# Patient Record
Sex: Female | Born: 1985 | Race: Asian | Hispanic: No | Smoking: Never smoker
Health system: Southern US, Community
[De-identification: ages and names within clinical notes are randomized; demographics above are authoritative.]

## PROBLEM LIST (undated history)

## (undated) DIAGNOSIS — K311 Adult hypertrophic pyloric stenosis: Secondary | ICD-10-CM

---

## 2014-05-08 HISTORY — PX: ESOPHAGOGASTRODUODENOSCOPY ENDOSCOPY: SHX5814

## 2014-06-08 ENCOUNTER — Inpatient Hospital Stay (HOSPITAL_COMMUNITY)
Admission: EM | Admit: 2014-06-08 | Discharge: 2014-06-13 | DRG: 177 | Disposition: A | Discharge status: Home / Self Care | Payer: PRIVATE HEALTH INSURANCE | Attending: Gastroenterology | Admitting: Gastroenterology

## 2014-06-08 ENCOUNTER — Encounter (HOSPITAL_COMMUNITY): Payer: Self-pay | Admitting: General Practice

## 2014-06-08 ENCOUNTER — Emergency Department (HOSPITAL_COMMUNITY): Payer: PRIVATE HEALTH INSURANCE

## 2014-06-08 DIAGNOSIS — K311 Adult hypertrophic pyloric stenosis: Secondary | ICD-10-CM

## 2014-06-08 DIAGNOSIS — J9601 Acute respiratory failure with hypoxia: Secondary | ICD-10-CM | POA: Diagnosis present

## 2014-06-08 DIAGNOSIS — R0602 Shortness of breath: Secondary | ICD-10-CM

## 2014-06-08 DIAGNOSIS — T17908A Unspecified foreign body in respiratory tract, part unspecified causing other injury, initial encounter: Secondary | ICD-10-CM

## 2014-06-08 DIAGNOSIS — J690 Pneumonitis due to inhalation of food and vomit: Principal | ICD-10-CM

## 2014-06-08 DIAGNOSIS — Q400 Congenital hypertrophic pyloric stenosis: Secondary | ICD-10-CM

## 2014-06-08 DIAGNOSIS — K209 Esophagitis, unspecified: Secondary | ICD-10-CM | POA: Diagnosis present

## 2014-06-08 DIAGNOSIS — T17998A Other foreign object in respiratory tract, part unspecified causing other injury, initial encounter: Secondary | ICD-10-CM

## 2014-06-08 DIAGNOSIS — T17908D Unspecified foreign body in respiratory tract, part unspecified causing other injury, subsequent encounter: Secondary | ICD-10-CM

## 2014-06-08 HISTORY — DX: Adult hypertrophic pyloric stenosis: K31.1

## 2014-06-08 LAB — I-STAT CHEM 8, ED
BUN: 11 mg/dL (ref 6–23)
Calcium, Ion: 1.14 mmol/L (ref 1.12–1.23)
Chloride: 102 mmol/L (ref 96–112)
Creatinine, Ser: 0.8 mg/dL (ref 0.50–1.10)
Glucose, Bld: 109 mg/dL — ABNORMAL HIGH (ref 70–99)
HEMATOCRIT: 44 % (ref 36.0–46.0)
HEMOGLOBIN: 15 g/dL (ref 12.0–15.0)
Potassium: 3.5 mmol/L (ref 3.5–5.1)
SODIUM: 141 mmol/L (ref 135–145)
TCO2: 24 mmol/L (ref 0–100)

## 2014-06-08 LAB — MRSA PCR SCREENING: MRSA BY PCR: NEGATIVE

## 2014-06-08 LAB — CBC WITH DIFFERENTIAL/PLATELET
BASOS PCT: 0 % (ref 0–1)
Basophils Absolute: 0 10*3/uL (ref 0.0–0.1)
EOS PCT: 1 % (ref 0–5)
Eosinophils Absolute: 0 10*3/uL (ref 0.0–0.7)
HEMATOCRIT: 42.2 % (ref 36.0–46.0)
HEMOGLOBIN: 13.5 g/dL (ref 12.0–15.0)
Lymphocytes Relative: 23 % (ref 12–46)
Lymphs Abs: 1.2 10*3/uL (ref 0.7–4.0)
MCH: 30.3 pg (ref 26.0–34.0)
MCHC: 32 g/dL (ref 30.0–36.0)
MCV: 94.8 fL (ref 78.0–100.0)
MONO ABS: 0.1 10*3/uL (ref 0.1–1.0)
MONOS PCT: 2 % — AB (ref 3–12)
Neutro Abs: 3.9 10*3/uL (ref 1.7–7.7)
Neutrophils Relative %: 74 % (ref 43–77)
Platelets: 207 10*3/uL (ref 150–400)
RBC: 4.45 MIL/uL (ref 3.87–5.11)
RDW: 13.5 % (ref 11.5–15.5)
WBC: 5.3 10*3/uL (ref 4.0–10.5)

## 2014-06-08 MED ORDER — SODIUM CHLORIDE 0.9 % IV SOLN
INTRAVENOUS | Status: DC
  Administered 2014-06-08 – 2014-06-09 (×4): via INTRAVENOUS

## 2014-06-08 MED ORDER — PANTOPRAZOLE SODIUM 40 MG IV SOLR
40.0000 mg | Freq: Two times a day (BID) | INTRAVENOUS | Status: DC
  Administered 2014-06-08 – 2014-06-09 (×3): 40 mg via INTRAVENOUS
  Filled 2014-06-08 (×4): qty 40

## 2014-06-08 MED ORDER — ACETAMINOPHEN 325 MG PO TABS
650.0000 mg | ORAL_TABLET | Freq: Four times a day (QID) | ORAL | Status: DC | PRN
Start: 2014-06-08 — End: 2014-06-13
  Administered 2014-06-09 – 2014-06-12 (×4): 650 mg via ORAL
  Filled 2014-06-08 (×4): qty 2

## 2014-06-08 MED ORDER — GUAIFENESIN 100 MG/5ML PO SYRP
200.0000 mg | ORAL_SOLUTION | ORAL | Status: DC | PRN
  Administered 2014-06-10 – 2014-06-12 (×7): 200 mg via ORAL
  Filled 2014-06-08 (×11): qty 10

## 2014-06-08 MED ORDER — SODIUM CHLORIDE 0.9 % IV BOLUS (SEPSIS)
500.0000 mL | Freq: Once | INTRAVENOUS | Status: AC
  Administered 2014-06-08: 500 mL via INTRAVENOUS

## 2014-06-08 NOTE — ED Provider Notes (Signed)
CSN: [id number]     Arrival date & time    History   First MD Initiated Contact with Patient 06/08/14 1252     Chief Complaint  Patient presents with  . Aspiration     (Consider location/radiation/quality/duration/timing/severity/associated sxs/prior Treatment) Patient is a 29 y.o. female presenting with shortness of breath. The history is provided by the EMS personnel (pt had an upper endoscopy today and aspirated.  she was sent here from endoscopy).  Shortness of Breath Severity:  Moderate Onset quality:  Sudden Timing:  Constant Progression:  Waxing and waning Chronicity:  New Context: not activity   Relieved by:  Nothing Worsened by:  Activity Ineffective treatments:  None tried Associated symptoms: no abdominal pain, no chest pain, no cough, no headaches and no rash     Past Medical History  Diagnosis Date  . Pyloric stenosis    History reviewed. No pertinent past surgical history. No family history on file. History  Substance Use Topics  . Smoking status: Never Smoker   . Smokeless tobacco: Not on file  . Alcohol Use: Not on file   OB History    No data available     Review of Systems  Constitutional: Negative for appetite change and fatigue.  HENT: Negative for congestion, ear discharge and sinus pressure.   Eyes: Negative for discharge.  Respiratory: Positive for shortness of breath. Negative for cough.   Cardiovascular: Negative for chest pain.  Gastrointestinal: Negative for abdominal pain and diarrhea.  Genitourinary: Negative for frequency and hematuria.  Musculoskeletal: Negative for back pain.  Skin: Negative for rash.  Neurological: Negative for seizures and headaches.  Psychiatric/Behavioral: Negative for hallucinations.      Allergies  Review of patient's allergies indicates not on file.  Home Medications   Prior to Admission medications   Not on File   BP 106/60 mmHg  Pulse 69  Temp(Src) 97.5 F (36.4 C) (Oral)  Resp 19  Ht 5\' 3"   (1.6 m)  Wt 110 lb (49.896 kg)  BMI 19.49 kg/m2  SpO2 95%  LMP 06/08/2014 Physical Exam  Constitutional: She is oriented to person, place, and time. She appears well-developed.  HENT:  Head: Normocephalic.  Eyes: Conjunctivae and EOM are normal. No scleral icterus.  Neck: Neck supple. No thyromegaly present.  Cardiovascular: Normal rate and regular rhythm.  Exam reveals no gallop and no friction rub.   No murmur heard. Pulmonary/Chest: No stridor. She has no wheezes. She has rales. She exhibits no tenderness.  Abdominal: She exhibits no distension. There is no tenderness. There is no rebound.  Musculoskeletal: Normal range of motion. She exhibits no edema.  Lymphadenopathy:    She has no cervical adenopathy.  Neurological: She is oriented to person, place, and time. She exhibits normal muscle tone. Coordination normal.  Skin: No rash noted. No erythema.  Psychiatric: She has a normal mood and affect. Her behavior is normal.    ED Course  Procedures (including critical care time) Labs Review Labs Reviewed  CBC WITH DIFFERENTIAL/PLATELET - Abnormal; Notable for the following:    Monocytes Relative 2 (*)    All other components within normal limits  I-STAT CHEM 8, ED - Abnormal; Notable for the following:    Glucose, Bld 109 (*)    All other components within normal limits    Imaging Review Dg Chest Port 1 View  06/08/2014   CLINICAL DATA:  Shortness of Breath  EXAM: PORTABLE CHEST - 1 VIEW  COMPARISON:  None.  FINDINGS: Cardiomediastinal  silhouette is unremarkable. There is gaseous distension of visualized stomach. There is streaky bilateral basilar atelectasis or infiltrate left greater than right. No pulmonary edema.  IMPRESSION: No pulmonary edema. Streaky bilateral basilar atelectasis or infiltrate left greater than right. Significant gaseous distension of visualized stomach.   Electronically Signed   By: Natasha Mead M.D.   On: 06/08/2014 13:21     EKG Interpretation None       MDM   Final diagnoses:  SOB (shortness of breath)    Admit for aspiration and hypoxia    Benny Lennert, MD 06/08/14 1427

## 2014-06-08 NOTE — ED Notes (Signed)
Attempted Report 

## 2014-06-08 NOTE — Progress Notes (Signed)
She continues to feel better.  She reports some soreness in her chest, which is expected.  Dr. Ulyses JarredMcQuaid's input is greatly appreciated.  Currently she is on 3.5 liters of oxygen and maintaining saturations at 98%.  Her HR is in the 90's and her RR is approximately 32.  I discussed the events that lead to this hospitalization.  I encouraged her to continue with incentive spirometry.  No antibiotics at this time, per Dr. Kendrick FriesMcQuaid, as this is being treated as a chemical pneumonitis.

## 2014-06-08 NOTE — H&P (Signed)
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  She was struggling with the symptoms since November and at the beginning of the year she started to have weight loss.  Two weeks ago her index EGd revealed the esophagitis and pyloric stenosis.  There was a significant amount of retained gastric contents at that time.  The pylorus was edematous, but with the pediatric colonoscope the pylorus was traversed and dilated with the passage of the colonoscope.  No issues occurred with that procedure and she was treated aggressively with Dexilant BID.  The patient followed up two weeks later and clinically she reported feeling "great", however, she still had some early satiety with her symptoms.  Additionally, she had lost 2 lbs from 114 lbs to 112 lbs.  The conclusion was that her weight loss was from the recommended liquid diet.  Discussion was made about a repeat EGD and optimally I wanted to have an update in one week, however, [place].  With the early satiety symptoms, we agreed to repeat the EGD.  During this procedure the esophagitis was improved and down graded to an LA Grade B esophagitis.  There was a moderate amount of retained gastric contents, which appeared to be less as compared to the index procedure.  The exact amount was difficult to assess as I decompressed the lumen to allow for pyloric channel passage.  I was not able to pass the adult endoscope through they pylorus.  A balloon dilator was then prepared to apply therapy to the site.  During the preparation of the balloon the gastric contents were suctioned, but particular material prevented a significant reduction of the contents.  The stenosis was dilated to 15 mm with easy passage of the endoscope and the plan was to dilate further to 16.5 mm.  At this time the patient started to wretch the  gastric contents were vomited.  The endoscopic procedure was terminated.  An Ambu bag was applied as her oxygen saturation decreased into the 88% range.  All gastric contents were suctioned from her oral cavity.  During this time her respirations were spontaneous, but she required assistance with the Ambu bag.  Her oxygen saturation ranged from 88%-95%.  With mild compression of the Ambu bag there was some resistance outside of her coughing.  Her oxygen saturation decreased in t6he low 80% range and an ET tube was emergently inserted.  Some gastric contents were noted in the ET tube and the tube was suctioned, which helped to clear the lumen.  Her O2 saturation in creased to 92-97%.  Slowly she started to regain consciousness, but she was rather combative.  It was clear that she was not going to tolerate the ET tube in a conscious state.  Prior to removing the ET tube a trail off of oxygen was performed and she was able to maintain her oxygen saturation from 94-97%.  At that point the tube was removed and she was respiring spontaneously.  EMS was called when the ET tube demonstrated gastric contents.  EMS transferred her from the endo unit to the ER in an alert and verbally responsive state.  She was on 6L of oxygen with a facemask.  Past Medical History  Diagnosis Date  . Pyloric stenosis     History reviewed. No pertinent past surgical history.  No family  history on file.  Social History:  reports that she has never smoked. She does not have any smokeless tobacco history on file. Her alcohol and drug histories are not on file.  Allergies: Not on File  Medications:  Scheduled: . pantoprazole (PROTONIX) IV  40 mg Intravenous Q12H   Continuous: . sodium chloride 150 mL/hr at 06/08/14 1401    Results for orders placed or performed during the hospital encounter of 06/08/14 (from the past 24 hour(s))  I-stat chem 8, ed     Status: Abnormal   Collection Time: 06/08/14  1:46 PM  Result Value Ref  Range   Sodium 141 135 - 145 mmol/L   Potassium 3.5 3.5 - 5.1 mmol/L   Chloride 102 96 - 112 mmol/L   BUN 11 6 - 23 mg/dL   Creatinine, Ser 1.61 0.50 - 1.10 mg/dL   Glucose, Bld 096 (H) 70 - 99 mg/dL   Calcium, Ion 0.45 4.09 - 1.23 mmol/L   TCO2 24 0 - 100 mmol/L   Hemoglobin 15.0 12.0 - 15.0 g/dL   HCT 81.1 91.4 - 78.2 %     Dg Chest Port 1 View  06/08/2014   CLINICAL DATA:  Shortness of Breath  EXAM: PORTABLE CHEST - 1 VIEW  COMPARISON:  None.  FINDINGS: Cardiomediastinal silhouette is unremarkable. There is gaseous distension of visualized stomach. There is streaky bilateral basilar atelectasis or infiltrate left greater than right. No pulmonary edema.  IMPRESSION: No pulmonary edema. Streaky bilateral basilar atelectasis or infiltrate left greater than right. Significant gaseous distension of visualized stomach.   Electronically Signed   By: Natasha Mead M.D.   On: 06/08/2014 13:21    ROS:  As stated above in the HPI otherwise negative.  Blood pressure 106/60, pulse 69, temperature 97.5 F (36.4 C), temperature source Oral, resp. rate 19, height  (1.6 m), weight 49.896 kg (110 lb), last menstrual period 06/08/2014, SpO2 95 %.    PE: Gen: NAD, Alert and Oriented HEENT:  Lillie/AT, EOMI Neck: Supple, no LAD Lungs: Some upper airway rhonchi and bibasilar crackles CV: RRR without M/G/R ABM: Soft, NTND, +BS Ext: No C/C/E  Assessment/Plan: 1) Aspiration pneumonia. 2) Pyloric stenosis. 3) LA Grade B esophagitis.   Clinically she is stable and she continues to improve, however, she still requires oxygen.  I will admit the patient and I requested a consult from Pulmonary to help manage her situation.    Plan: 1) CXR. 2) Antibiotics per Pulmonary. 3) Admit to Stepdown. 4) IV hydration. 5) NPO for now. 6) PPI  Lowery Paullin D 06/08/2014, 1:56 PM

## 2014-06-08 NOTE — ED Notes (Signed)
Pt brought in via EMS. [facility]. During procedure pt started throwing up "chucks". Pt was on propofol sedative. Post emesis pt was extubated prior to EMS arrival. Pt reports being NPO prior to procedure. EMS V/S 96% RA, 108/70-B/P, 70 HR, NSR.

## 2014-06-08 NOTE — Consult Note (Signed)
PULMONARY / CRITICAL CARE MEDICINE   Name: Amy Farrell MRN: [id number] DOB: Feb 09, 1986    ADMISSION DATE:  06/08/2014 CONSULTATION DATE:  06/08/2014  REFERRING MD :  Elnoria Howard  CHIEF COMPLAINT:  Shortness of breath, cough  INITIAL PRESENTATION: 29 y/o female with pyloric stenosis who underwent endoscopy on 06/08/2014 and unfortunately had an aspiration event during the procedure. She was admitted on 06/08/2014 for observation at [facility]. Pulmonary and critical care medicine was consulted for further evaluation.  STUDIES:    SIGNIFICANT EVENTS:    HISTORY OF PRESENT ILLNESS:  This is a 29 year old female with a past medical history significant for pyloric stenosis and esophagitis who underwent an endoscopy on 06/08/2014 for further evaluation of the same. The procedure initially was uncomplicated and she did receive balloon dilation of the pyloric stenosis but unfortunately she had aspiration of retained gastric contents. This was performed at an outpatient endoscopy center. Because of acute hypoxemic respiratory failure she was intubated temporarily at the conclusion of the procedure. She was able to be extubated but she was still required high oxygen requirements and so she was brought to [facility] for further evaluation of hypoxemic respiratory failure. In the emergency department her oxygen requirements have decreased in her shortness of breath has improved but she continues to have a cough. She also notes some mild throat pain.  PAST MEDICAL HISTORY :   has a past medical history of Pyloric stenosis.  has no past surgical history on file. Prior to Admission medications   Not on File   Not on File  FAMILY HISTORY:  has no family status information on file.  SOCIAL HISTORY:  reports that she has never smoked. She does not have any smokeless tobacco history on file.  REVIEW OF SYSTEMS:   Gen: Denies fever, chills, weight change, fatigue, night sweats HEENT: Denies  blurred vision, double vision, hearing loss, tinnitus, sinus congestion, rhinorrhea, sore throat, neck stiffness, dysphagia PULM: Denies shortness of breath, cough, sputum production, hemoptysis, wheezing CV: Denies chest pain, edema, orthopnea, paroxysmal nocturnal dyspnea, palpitations GI: Denies abdominal pain, nausea, vomiting, diarrhea, hematochezia, melena, constipation, change in bowel habits GU: Denies dysuria, hematuria, polyuria, oliguria, urethral discharge Endocrine: Denies hot or cold intolerance, polyuria, polyphagia or appetite change Derm: Denies rash, dry skin, scaling or peeling skin change Heme: Denies easy bruising, bleeding, bleeding gums Neuro: Denies headache, numbness, weakness, slurred speech, loss of memory or consciousness  SUBJECTIVE:   VITAL SIGNS: Temp:  [97.5 F (36.4 C)] 97.5 F (36.4 C) (03/03 1233) Pulse Rate:  [63-93] 93 (03/03 1430) Resp:  [16-31] 27 (03/03 1430) BP: (95-113)/(54-75) 108/59 mmHg (03/03 1430) SpO2:  [79 %-100 %] 95 % (03/03 1430) Weight:  [49.896 kg (110 lb)] 49.896 kg (110 lb) (03/03 1233) HEMODYNAMICS:   VENTILATOR SETTINGS:   INTAKE / OUTPUT: No intake or output data in the 24 hours ending 06/08/14 1541  PHYSICAL EXAMINATION: General:  Comfortable in bed Neuro:  Awake and alert, non-focal HEENT:  NCAT EOMi Cardiovascular:  RRR, no mgr Lungs:  Scattered crackles and wheezes Abdomen:  BS+, soft, nontender Musculoskeletal:  Normal bulk and tone Skin:  No breakdown  LABS:  CBC  Recent Labs Lab 06/08/14 1332 06/08/14 1346  WBC 5.3  --   HGB 13.5 15.0  HCT 42.2 44.0  PLT 207  --    Coag's No results for input(s): APTT, INR in the last 168 hours. BMET  Recent Labs Lab 06/08/14 1346  NA 141  K 3.5  CL 102  BUN 11  CREATININE 0.80  GLUCOSE 109*   Electrolytes No results for input(s): CALCIUM, MG, PHOS in the last 168 hours. Sepsis Markers No results for input(s): LATICACIDVEN, PROCALCITON, O2SATVEN in  the last 168 hours. ABG No results for input(s): PHART, PCO2ART, PO2ART in the last 168 hours. Liver Enzymes No results for input(s): AST, ALT, ALKPHOS, BILITOT, ALBUMIN in the last 168 hours. Cardiac Enzymes No results for input(s): TROPONINI, PROBNP in the last 168 hours. Glucose No results for input(s): GLUCAP in the last 168 hours.  Imaging No results found.   ASSESSMENT / PLAN:  PULMONARY A: Acute hypoxemic respiratory failure in the setting of aspiration pneumonitis > currently protecting airway, tachypnic but comfortable, oxygen needs are improving P:   Wean O2 as tolerated for O2 saturation > 92% Incentive spirometry Flutter valve Guaifenesin Monitor in ICU setting Hold antibiotics   GASTROINTESTINAL A:  Pyloric stenosis P:   Per GI Diet per GI OK to eat from my standpoint   Heber CarolinaBrent McQuaid, MD [facility] Pager: [phone] Cell: [phone] If no response, call [phone]  06/08/2014, 3:41 PM

## 2014-06-09 DIAGNOSIS — T17998D Other foreign object in respiratory tract, part unspecified causing other injury, subsequent encounter: Secondary | ICD-10-CM

## 2014-06-09 MED ORDER — KETOROLAC TROMETHAMINE 15 MG/ML IJ SOLN
INTRAMUSCULAR | Status: AC
  Filled 2014-06-09: qty 1

## 2014-06-09 MED ORDER — KETOROLAC TROMETHAMINE 15 MG/ML IJ SOLN: 15.0000 mg | Freq: Three times a day (TID) | INTRAMUSCULAR | Status: DC | PRN

## 2014-06-09 MED ORDER — PANTOPRAZOLE SODIUM 40 MG PO TBEC
40.0000 mg | DELAYED_RELEASE_TABLET | Freq: Two times a day (BID) | ORAL | Status: DC
  Administered 2014-06-09 – 2014-06-13 (×8): 40 mg via ORAL
  Filled 2014-06-09 (×8): qty 1

## 2014-06-09 MED ORDER — KETOROLAC TROMETHAMINE 15 MG/ML IJ SOLN
15.0000 mg | Freq: Three times a day (TID) | INTRAMUSCULAR | Status: DC | PRN
  Administered 2014-06-09: 15 mg via INTRAVENOUS

## 2014-06-09 NOTE — Progress Notes (Signed)
PULMONARY / CRITICAL CARE MEDICINE   Name: Amy Farrell MRN: [id number] DOB: June 03, 1985    ADMISSION DATE:  06/08/2014 CONSULTATION DATE:  06/08/2014  REFERRING MD :  Elnoria HowardHung  CHIEF COMPLAINT:  Shortness of breath, cough  INITIAL PRESENTATION:  29 yo with aspiration event after endoscopy for pyloric stenosis.  STUDIES:   SIGNIFICANT EVENTS: 3/03 Admit  SUBJECTIVE:  Was having chest discomfort last night >> better after pain meds.  She has cough >> swallowing sputum.  Denies wheeze.  VITAL SIGNS: Temp:  [97.5 F (36.4 C)-99.8 F (37.7 C)] 98.1 F (36.7 C) (03/04 0700) Pulse Rate:  [63-114] 89 (03/04 0912) Resp:  [16-43] 31 (03/04 0912) BP: (81-113)/(40-75) 110/56 mmHg (03/04 0800) SpO2:  [79 %-100 %] 97 % (03/04 0912) Weight:  [110 lb (49.896 kg)-112 lb 10.5 oz (51.1 kg)] 112 lb 10.5 oz (51.1 kg) (03/03 1627) INTAKE / OUTPUT:  Intake/Output Summary (Last 24 hours) at 06/09/14 1059 Last data filed at 06/09/14 0145  Gross per 24 hour  Intake   1520 ml  Output      2 ml  Net   1518 ml    PHYSICAL EXAMINATION: General: pleasant Neuro: normal strength HEENT: no sinus tenderness Cardiovascular: regular Lungs: faint basilar crackles, no wheeze Abdomen: soft, non tender Musculoskeletal:  No edema Skin: no rashes   LABS: CMP Latest Ref Rng 06/08/2014  Glucose 70 - 99 mg/dL 409(W109(H)  BUN 6 - 23 mg/dL 11  Creatinine 1.190.50 - 1.471.10 mg/dL 8.290.80  Sodium 562135 - 130145 mmol/L 141  Potassium 3.5 - 5.1 mmol/L 3.5  Chloride 96 - 112 mmol/L 102    CBC    Component Value Date/Time   WBC 5.3 06/08/2014 1332   RBC 4.45 06/08/2014 1332   HGB 15.0 06/08/2014 1346   HCT 44.0 06/08/2014 1346   PLT 207 06/08/2014 1332   MCV 94.8 06/08/2014 1332   MCH 30.3 06/08/2014 1332   MCHC 32.0 06/08/2014 1332   RDW 13.5 06/08/2014 1332   LYMPHSABS 1.2 06/08/2014 1332   MONOABS 0.1 06/08/2014 1332   EOSABS 0.0 06/08/2014 1332   BASOSABS 0.0 06/08/2014 1332    Dg Chest Port 1 View  06/08/2014    CLINICAL DATA:  Shortness of Breath  EXAM: PORTABLE CHEST - 1 VIEW  COMPARISON:  None.  FINDINGS: Cardiomediastinal silhouette is unremarkable. There is gaseous distension of visualized stomach. There is streaky bilateral basilar atelectasis or infiltrate left greater than right. No pulmonary edema.  IMPRESSION: No pulmonary edema. Streaky bilateral basilar atelectasis or infiltrate left greater than right. Significant gaseous distension of visualized stomach.   Electronically Signed   By: Natasha MeadLiviu  Pop M.D.   On: 06/08/2014 13:21      ASSESSMENT / PLAN:  Acute hypoxic respiratory failure 2nd to aspiration event. Plan: - oxygen as needed to keep SpO2 > 92% - f/u CXR 3/05 - bronchial hygiene - defer ABx for now  Pyloric stenosis Plan: - per GI  Okay to transfer out of SDU from pulmonary standpoint >> if she is stable clinically and no significant findings on CXR 3/05, then she might be ready for d/c home soon.   Coralyn HellingVineet Cypher Paule, MD [facility] 06/09/2014, 11:02 AM Pager:  [phone] After 3pm call: [phone]

## 2014-06-09 NOTE — Progress Notes (Signed)
UR COMPLETED  

## 2014-06-09 NOTE — Progress Notes (Signed)
Subjective: She did not sleep well at night.  Feeling all right, but still with chest pain.  Objective: Vital signs in last 24 hours: Temp:  [97.5 F (36.4 C)-99.8 F (37.7 C)] 98.1 F (36.7 C) (03/04 0700) Pulse Rate:  [63-114] 89 (03/04 0912) Resp:  [16-43] 31 (03/04 0912) BP: (81-113)/(40-75) 110/56 mmHg (03/04 0800) SpO2:  [79 %-100 %] 97 % (03/04 0912) Weight:  [49.896 kg (110 lb)-51.1 kg (112 lb 10.5 oz)] 51.1 kg (112 lb 10.5 oz) (03/03 1627)    Intake/Output from previous day: 03/03 0701 - 03/04 0700 In: 1520 [P.O.:240; I.V.:1280] Out: 2 [Urine:2] Intake/Output this shift:    General appearance: alert and no distress Resp: clear to auscultation bilaterally GI: soft, non-tender; bowel sounds normal; no masses,  no organomegaly Extremities: extremities normal, atraumatic, no cyanosis or edema  Lab Results:  Recent Labs  06/08/14 1332 06/08/14 1346  WBC 5.3  --   HGB 13.5 15.0  HCT 42.2 44.0  PLT 207  --    BMET  Recent Labs  06/08/14 1346  NA 141  K 3.5  CL 102  GLUCOSE 109*  BUN 11  CREATININE 0.80   LFT No results for input(s): PROT, ALBUMIN, AST, ALT, ALKPHOS, BILITOT, BILIDIR, IBILI in the last 72 hours. PT/INR No results for input(s): LABPROT, INR in the last 72 hours. Hepatitis Panel No results for input(s): HEPBSAG, HCVAB, HEPAIGM, HEPBIGM in the last 72 hours. C-Diff No results for input(s): CDIFFTOX in the last 72 hours. Fecal Lactopherrin No results for input(s): FECLLACTOFRN in the last 72 hours.  Studies/Results: Dg Chest Port 1 View  06/08/2014   CLINICAL DATA:  Shortness of Breath  EXAM: PORTABLE CHEST - 1 VIEW  COMPARISON:  None.  FINDINGS: Cardiomediastinal silhouette is unremarkable. There is gaseous distension of visualized stomach. There is streaky bilateral basilar atelectasis or infiltrate left greater than right. No pulmonary edema.  IMPRESSION: No pulmonary edema. Streaky bilateral basilar atelectasis or infiltrate left  greater than right. Significant gaseous distension of visualized stomach.   Electronically Signed   By: Natasha MeadLiviu  Pop M.D.   On: 06/08/2014 13:21    Medications:  Scheduled: . pantoprazole (PROTONIX) IV  40 mg Intravenous Q12H   Continuous: . sodium chloride 150 mL/hr at 06/09/14 [id number]    Assessment/Plan: 1) Aspiration pneumonitis. 2) Pyloric stenosis.   The patient has improved, but she has note been able to tolerate O2 weaning beyond 4 liters.  Her O2 sats stay in the 90's, but her RR increases into the 40's.    Plan: 1) Incentive spirometry. 2) Wean O2 as tolerated. 3) Advance to a soft diet.   LOS: 1 day   Javontae Marlette D 06/09/2014, 9:26 AM

## 2014-06-09 NOTE — Progress Notes (Signed)
Pt to TX to [id number]-[id number], VSS, called report.

## 2014-06-10 ENCOUNTER — Inpatient Hospital Stay (HOSPITAL_COMMUNITY): Payer: PRIVATE HEALTH INSURANCE

## 2014-06-10 DIAGNOSIS — J690 Pneumonitis due to inhalation of food and vomit: Secondary | ICD-10-CM | POA: Insufficient documentation

## 2014-06-10 DIAGNOSIS — R0602 Shortness of breath: Secondary | ICD-10-CM

## 2014-06-10 MED ORDER — SODIUM CHLORIDE 0.9 % IV SOLN
1.5000 g | Freq: Four times a day (QID) | INTRAVENOUS | Status: DC
  Administered 2014-06-10 – 2014-06-13 (×12): 1.5 g via INTRAVENOUS
  Filled 2014-06-10 (×15): qty 1.5

## 2014-06-10 MED ORDER — KETOROLAC TROMETHAMINE 15 MG/ML IJ SOLN
15.0000 mg | Freq: Three times a day (TID) | INTRAMUSCULAR | Status: DC | PRN
  Administered 2014-06-10 – 2014-06-12 (×5): 15 mg via INTRAVENOUS
  Filled 2014-06-10 (×7): qty 1

## 2014-06-10 NOTE — Progress Notes (Signed)
Attempted to wean pt to 3L [place] overnight. Pt sats remained in mid 90s during this time. Pt tolerated well for approximately 2.5-3 hrs, but then stated that she felt like she was having trouble breathing. Increased O2 to 4L at pt's request. Nursing will continue to monitor.

## 2014-06-10 NOTE — Consult Note (Signed)
PULMONARY / CRITICAL CARE MEDICINE   Name: Amy Farrell MRN: [id number] DOB: July 25, 1985    ADMISSION DATE:  06/08/2014 CONSULTATION DATE:  06/08/2014  REFERRING MD :  Elnoria HowardHung  CHIEF COMPLAINT:  Shortness of breath, cough  INITIAL PRESENTATION: 29 y/o female with pyloric stenosis who underwent endoscopy on 06/08/2014 and unfortunately had an aspiration event during the procedure. She was admitted on 06/08/2014 [facility]. Pulmonary and critical care medicine was consulted for further evaluation.  STUDIES:    SIGNIFICANT EVENTS:    SUBJECTIVE:  3/5-Adequate English. Visitor in room. Reports starting to feel sweat and chill, sore anterior chest with cough. Swallows phlegm. Unable to wean O2 below 4L overnight.  VITAL SIGNS: Temp:  [98.2 F (36.8 C)-100 F (37.8 C)] 100 F (37.8 C) (03/05 0500) Pulse Rate:  [83-112] 103 (03/05 0500) Resp:  [20-31] 20 (03/05 0500) BP: (83-113)/(50-62) 113/61 mmHg (03/05 0500) SpO2:  [94 %-100 %] 94 % (03/05 0500) HEMODYNAMICS:   VENTILATOR SETTINGS:   INTAKE / OUTPUT:  Intake/Output Summary (Last 24 hours) at 06/10/14 0838 Last data filed at 06/10/14 0600  Gross per 24 hour  Intake    360 ml  Output      0 ml  Net    360 ml   CXR 3/5  PHYSICAL EXAMINATION: General:  Comfortable in bed Neuro:  Awake and alert, non-focal HEENT:  NCAT EOMi Cardiovascular:  RRR, no mgr Lungs:  Scattered crackles and wheezes, not labored short sentences, 4L nasal O2 94% Abdomen:  BS+, soft, nontender Musculoskeletal:  Normal bulk and tone Skin:  No breakdown  LABS:  CBC  Recent Labs Lab 06/08/14 1332 06/08/14 1346  WBC 5.3  --   HGB 13.5 15.0  HCT 42.2 44.0  PLT 207  --    Coag's No results for input(s): APTT, INR in the last 168 hours. BMET  Recent Labs Lab 06/08/14 1346  NA 141  K 3.5  CL 102  BUN 11  CREATININE 0.80  GLUCOSE 109*   Electrolytes No results for input(s): CALCIUM, MG, PHOS in the last 168  hours. Sepsis Markers No results for input(s): LATICACIDVEN, PROCALCITON, O2SATVEN in the last 168 hours. ABG No results for input(s): PHART, PCO2ART, PO2ART in the last 168 hours. Liver Enzymes No results for input(s): AST, ALT, ALKPHOS, BILITOT, ALBUMIN in the last 168 hours. Cardiac Enzymes No results for input(s): TROPONINI, PROBNP in the last 168 hours. Glucose No results for input(s): GLUCAP in the last 168 hours.  Imaging No results found.   ASSESSMENT / PLAN:  PULMONARY A: Acute hypoxemic respiratory failure in the setting of aspiration pneumonitis > currently protecting airway. She reports more discomfort. P:   Starting abx Unasyn 3/5>> CXR and CBC 3/6 Wean O2 as tolerated for O2 saturation > 92% Incentive spirometry Flutter valve Guaifenesin    GASTROINTESTINAL A:  Pyloric stenosis P:   Per GI Diet per GI OK to eat from my standpoint   CD Zamantha Strebel, MD,   PCCM p [phone]    m [phone] If no response or after 3:00 PM, call [phone]  06/10/2014, 8:38 AM

## 2014-06-10 NOTE — Progress Notes (Signed)
Subjective: Fever and chills last evening.  Objective: Vital signs in last 24 hours: Temp:  [98.2 F (36.8 C)-100 F (37.8 C)] 100 F (37.8 C) (03/05 0500) Pulse Rate:  [84-112] 103 (03/05 0500) Resp:  [20-31] 20 (03/05 0500) BP: (83-113)/(50-62) 113/61 mmHg (03/05 0500) SpO2:  [94 %-100 %] 94 % (03/05 0500) Last BM Date: 06/07/14  Intake/Output from previous day: 03/04 0701 - 03/05 0700 In: 360 [P.O.:360] Out: -  Intake/Output this shift:    General appearance: alert and fatigued Resp: some crackles as the bases GI: soft, non-tender; bowel sounds normal; no masses,  no organomegaly  Lab Results:  Recent Labs  06/08/14 1332 06/08/14 1346  WBC 5.3  --   HGB 13.5 15.0  HCT 42.2 44.0  PLT 207  --    BMET  Recent Labs  06/08/14 1346  NA 141  K 3.5  CL 102  GLUCOSE 109*  BUN 11  CREATININE 0.80   LFT No results for input(s): PROT, ALBUMIN, AST, ALT, ALKPHOS, BILITOT, BILIDIR, IBILI in the last 72 hours. PT/INR No results for input(s): LABPROT, INR in the last 72 hours. Hepatitis Panel No results for input(s): HEPBSAG, HCVAB, HEPAIGM, HEPBIGM in the last 72 hours. C-Diff No results for input(s): CDIFFTOX in the last 72 hours. Fecal Lactopherrin No results for input(s): FECLLACTOFRN in the last 72 hours.  Studies/Results: Dg Chest 2 View  06/10/2014   CLINICAL DATA:  Aspiration into the airway. Shortness of breath with midsternal chest pain for 2 days ago  EXAM: CHEST  2 VIEW  COMPARISON:  06/08/2014.  FINDINGS: The heart size and mediastinal contours are stable. There is significant interval worsening of bibasilar airspace opacities with an asymmetric component in the left perihilar region. There are probable small bilateral pleural effusions. No pneumothorax. Mild convex right thoracic scoliosis.  IMPRESSION: Significant worsening of bilateral airspace opacities consistent with aspiration pneumonia.   Electronically Signed   By: Carey BullocksWilliam  Veazey M.D.   On:  06/10/2014 10:52   Dg Chest Port 1 View  06/08/2014   CLINICAL DATA:  Shortness of Breath  EXAM: PORTABLE CHEST - 1 VIEW  COMPARISON:  None.  FINDINGS: Cardiomediastinal silhouette is unremarkable. There is gaseous distension of visualized stomach. There is streaky bilateral basilar atelectasis or infiltrate left greater than right. No pulmonary edema.  IMPRESSION: No pulmonary edema. Streaky bilateral basilar atelectasis or infiltrate left greater than right. Significant gaseous distension of visualized stomach.   Electronically Signed   By: Natasha MeadLiviu  Pop M.D.   On: 06/08/2014 13:21    Medications:  Scheduled: . ampicillin-sulbactam (UNASYN) IV  1.5 g Intravenous Q6H  . pantoprazole  40 mg Oral BID AC   Continuous:   Assessment/Plan: 1) Aspiration pneumonia. 2) Pyloric stenosis.   CXR reveals a significant worsening of the bilateral airspaces consistent with aspiration pneumonia.  She feels weaker today and she had a mild elevation in her temp at 100 F.  Dr. Maple HudsonYoung initiated Unasyn.  Plan: 1) Unasyn. 2) Continue with PPI. 3) Change Toradol to IV.   LOS: 2 days   Katherina Wimer D 06/10/2014, 11:09 AM

## 2014-06-11 DIAGNOSIS — K311 Adult hypertrophic pyloric stenosis: Secondary | ICD-10-CM

## 2014-06-11 DIAGNOSIS — Q400 Congenital hypertrophic pyloric stenosis: Secondary | ICD-10-CM

## 2014-06-11 LAB — CBC WITH DIFFERENTIAL/PLATELET
Basophils Absolute: 0 10*3/uL (ref 0.0–0.1)
Basophils Relative: 0 % (ref 0–1)
EOS ABS: 0.2 10*3/uL (ref 0.0–0.7)
Eosinophils Relative: 2 % (ref 0–5)
HEMATOCRIT: 36.4 % (ref 36.0–46.0)
HEMOGLOBIN: 11.9 g/dL — AB (ref 12.0–15.0)
LYMPHS ABS: 0.7 10*3/uL (ref 0.7–4.0)
Lymphocytes Relative: 7 % — ABNORMAL LOW (ref 12–46)
MCH: 30.1 pg (ref 26.0–34.0)
MCHC: 32.7 g/dL (ref 30.0–36.0)
MCV: 92.2 fL (ref 78.0–100.0)
MONO ABS: 0.3 10*3/uL (ref 0.1–1.0)
MONOS PCT: 3 % (ref 3–12)
NEUTROS PCT: 88 % — AB (ref 43–77)
Neutro Abs: 9.8 10*3/uL — ABNORMAL HIGH (ref 1.7–7.7)
Platelets: 181 10*3/uL (ref 150–400)
RBC: 3.95 MIL/uL (ref 3.87–5.11)
RDW: 14.1 % (ref 11.5–15.5)
WBC: 11.1 10*3/uL — AB (ref 4.0–10.5)

## 2014-06-11 LAB — BASIC METABOLIC PANEL
Anion gap: 6 (ref 5–15)
BUN: 9 mg/dL (ref 6–23)
CO2: 24 mmol/L (ref 19–32)
Calcium: 8.1 mg/dL — ABNORMAL LOW (ref 8.4–10.5)
Chloride: 107 mmol/L (ref 96–112)
Creatinine, Ser: 0.72 mg/dL (ref 0.50–1.10)
GFR calc Af Amer: >90 mL/min (ref >90)
GFR calc non Af Amer: >90 mL/min (ref >90)
GLUCOSE: 83 mg/dL (ref 70–99)
POTASSIUM: 3.7 mmol/L (ref 3.5–5.1)
SODIUM: 137 mmol/L (ref 135–145)

## 2014-06-11 NOTE — Progress Notes (Signed)
RN contacted RT concerning a Flutter order from several days ago. RN states that PT is not congested any longer and is capable of deep breathing and coughing.

## 2014-06-11 NOTE — Progress Notes (Signed)
PULMONARY / CRITICAL CARE MEDICINE   Name: Amy Farrell MRN: [id number] DOB: Feb 11, 1986    ADMISSION DATE:  06/08/2014 CONSULTATION DATE:  06/08/2014  REFERRING MD :  Elnoria HowardHung  CHIEF COMPLAINT:  Shortness of breath, cough  INITIAL PRESENTATION: 29 y/o female with pyloric stenosis who underwent endoscopy on 06/08/2014 and unfortunately had an aspiration event during the procedure. She was admitted on 06/08/2014 for observation at [facility]. Pulmonary and critical care medicine was consulted for further evaluation.  STUDIES:    SIGNIFICANT EVENTS:    SUBJECTIVE:  3/6- Feels better, not as tired, temp down.  VITAL SIGNS: Temp:  [98.4 F (36.9 C)-98.6 F (37 C)] 98.4 F (36.9 C) (03/06 0443) Pulse Rate:  [90-99] 91 (03/06 0443) Resp:  [18] 18 (03/06 0443) BP: (93-117)/(58-71) 117/71 mmHg (03/06 0443) SpO2:  [91 %-93 %] 93 % (03/06 0443) HEMODYNAMICS:   VENTILATOR SETTINGS:   INTAKE / OUTPUT:  Intake/Output Summary (Last 24 hours) at 06/11/14 0948 Last data filed at 06/11/14 0444  Gross per 24 hour  Intake    630 ml  Output      3 ml  Net    627 ml   CXR 3/5  PHYSICAL EXAMINATION: General:  Comfortable in bed Neuro:  Awake and alert, non-focal HEENT:  NCAT EOMi Cardiovascular:  RRR, no mgr Lungs:  Few crackles, unlabored Abdomen:  BS+, soft, nontender Musculoskeletal:  Normal bulk and tone Skin:  No breakdown  LABS:  CBC  Recent Labs Lab 06/08/14 1332 06/08/14 1346 06/11/14 0451  WBC 5.3  --  11.1*  HGB 13.5 15.0 11.9*  HCT 42.2 44.0 36.4  PLT 207  --  181   Coag's No results for input(s): APTT, INR in the last 168 hours. BMET  Recent Labs Lab 06/08/14 1346 06/11/14 0451  NA 141 137  K 3.5 3.7  CL 102 107  CO2  --  24  BUN 11 9  CREATININE 0.80 0.72  GLUCOSE 109* 83   Electrolytes  Recent Labs Lab 06/11/14 0451  CALCIUM 8.1*   Sepsis Markers No results for input(s): LATICACIDVEN, PROCALCITON, O2SATVEN in the last 168  hours. ABG No results for input(s): PHART, PCO2ART, PO2ART in the last 168 hours. Liver Enzymes No results for input(s): AST, ALT, ALKPHOS, BILITOT, ALBUMIN in the last 168 hours. Cardiac Enzymes No results for input(s): TROPONINI, PROBNP in the last 168 hours. Glucose No results for input(s): GLUCAP in the last 168 hours.  Imaging Dg Chest 2 View  06/10/2014   CLINICAL DATA:  Aspiration into the airway. Shortness of breath with midsternal chest pain for 2 days ago  EXAM: CHEST  2 VIEW  COMPARISON:  06/08/2014.  FINDINGS: The heart size and mediastinal contours are stable. There is significant interval worsening of bibasilar airspace opacities with an asymmetric component in the left perihilar region. There are probable small bilateral pleural effusions. No pneumothorax. Mild convex right thoracic scoliosis.  IMPRESSION: Significant worsening of bilateral airspace opacities consistent with aspiration pneumonia.   Electronically Signed   By: Carey BullocksWilliam  Veazey M.D.   On: 06/10/2014 10:52     ASSESSMENT / PLAN:  PULMONARY A: Acute hypoxemic respiratory failure in the setting of aspiration pneumonitis > currently protecting airway.      Aspiration pneumonia P:   Unasyn 3/5>> CXR, CBC 3/7 Wean O2 as tolerated for O2 saturation > 92% Incentive spirometry Flutter valve Guaifenesin    GASTROINTESTINAL A:  Pyloric stenosis P:   Per GI Diet per GI Eating  CD Alyssah Algeo, MD,   PCCM p [phone]    m [phone] If no response or after 3:00 PM, call [phone]  06/11/2014, 9:48 AM

## 2014-06-11 NOTE — Progress Notes (Signed)
     Progress Note   Subjective  *Complaining of mild nausea and pyrosis.  Continues with cough.**   Objective  Vital signs in last 24 hours: Temp:  [98.4 F (36.9 C)-98.6 F (37 C)] 98.4 F (36.9 C) (03/06 0443) Pulse Rate:  [90-99] 91 (03/06 0443) Resp:  [18] 18 (03/06 0443) BP: (93-117)/(58-71) 117/71 mmHg (03/06 0443) SpO2:  [91 %-93 %] 93 % (03/06 0443) Last BM Date: 06/07/14 General:   Alert,  Well-developed,   in NAD Heart:  Regular rate and rhythm; no murmurs Chest-left basilar rales Abdomen:  Soft, nontender and nondistended. Normal bowel sounds, without guarding, and without rebound.   Extremities:  Without edema. Neurologic:  Alert and  oriented x4;  grossly normal neurologically. Psych:  Alert and cooperative. Normal mood and affect.  Intake/Output from previous day: 03/05 0701 - 03/06 0700 In: 630 [P.O.:480; IV Piggyback:150] Out: 4 [Urine:4] Intake/Output this shift:    Lab Results:  Recent Labs  06/08/14 1332 06/08/14 1346 06/11/14 0451  WBC 5.3  --  11.1*  HGB 13.5 15.0 11.9*  HCT 42.2 44.0 36.4  PLT 207  --  181   BMET  Recent Labs  06/08/14 1346 06/11/14 0451  NA 141 137  K 3.5 3.7  CL 102 107  CO2  --  24  GLUCOSE 109* 83  BUN 11 9  CREATININE 0.80 0.72  CALCIUM  --  8.1*   LFT No results for input(s): PROT, ALBUMIN, AST, ALT, ALKPHOS, BILITOT, BILIDIR, IBILI in the last 72 hours. PT/INR No results for input(s): LABPROT, INR in the last 72 hours. Hepatitis Panel No results for input(s): HEPBSAG, HCVAB, HEPAIGM, HEPBIGM in the last 72 hours.  Studies/Results: Dg Chest 2 View  06/10/2014   CLINICAL DATA:  Aspiration into the airway. Shortness of breath with midsternal chest pain for 2 days ago  EXAM: CHEST  2 VIEW  COMPARISON:  06/08/2014.  FINDINGS: The heart size and mediastinal contours are stable. There is significant interval worsening of bibasilar airspace opacities with an asymmetric component in the left perihilar region.  There are probable small bilateral pleural effusions. No pneumothorax. Mild convex right thoracic scoliosis.  IMPRESSION: Significant worsening of bilateral airspace opacities consistent with aspiration pneumonia.   Electronically Signed   By: Carey BullocksWilliam  Veazey M.D.   On: 06/10/2014 10:52      Assessment & Plan  *1.  Pyloric stenosis - status post dilatation therapy.  Patient is tolerating diet and appears to be unobstructed 2.  Aspiration pneumonia  Recommendations - continue current antibiotics and diet** Active Problems:   Aspiration into airway   Aspiration pneumonia   SOB (shortness of breath)     LOS: 3 days   Amy HeapsRobert Lauris Farrell  06/11/2014, 11:02 AM

## 2014-06-12 ENCOUNTER — Inpatient Hospital Stay (HOSPITAL_COMMUNITY): Payer: PRIVATE HEALTH INSURANCE

## 2014-06-12 DIAGNOSIS — R0902 Hypoxemia: Secondary | ICD-10-CM

## 2014-06-12 LAB — CBC WITH DIFFERENTIAL/PLATELET
Basophils Absolute: 0 10*3/uL (ref 0.0–0.1)
Basophils Relative: 0 % (ref 0–1)
Eosinophils Absolute: 0.2 10*3/uL (ref 0.0–0.7)
Eosinophils Relative: 3 % (ref 0–5)
HCT: 36.2 % (ref 36.0–46.0)
HEMOGLOBIN: 11.8 g/dL — AB (ref 12.0–15.0)
LYMPHS ABS: 0.7 10*3/uL (ref 0.7–4.0)
Lymphocytes Relative: 12 % (ref 12–46)
MCH: 29.6 pg (ref 26.0–34.0)
MCHC: 32.6 g/dL (ref 30.0–36.0)
MCV: 91 fL (ref 78.0–100.0)
MONOS PCT: 8 % (ref 3–12)
Monocytes Absolute: 0.5 10*3/uL (ref 0.1–1.0)
Neutro Abs: 4.4 10*3/uL (ref 1.7–7.7)
Neutrophils Relative %: 77 % (ref 43–77)
Platelets: 183 10*3/uL (ref 150–400)
RBC: 3.98 MIL/uL (ref 3.87–5.11)
RDW: 13.8 % (ref 11.5–15.5)
WBC: 5.7 10*3/uL (ref 4.0–10.5)

## 2014-06-12 MED ORDER — GUAIFENESIN 100 MG/5ML PO SYRP: 200.0000 mg | ORAL_SOLUTION | ORAL | Status: DC | PRN

## 2014-06-12 MED ORDER — AMOXICILLIN-POT CLAVULANATE 875-125 MG PO TABS: 1.0000 | ORAL_TABLET | Freq: Two times a day (BID) | ORAL | Status: DC

## 2014-06-12 NOTE — Progress Notes (Signed)
Order for check pulse ox while ambulating.  Exercise performed twice, sats fluctuated from 84-93% on room air ambulating.  No distress noted upon walk.  Spoke with Joneen RoachPaul Hoffman, NP from pulmonary, recommended for patient to spend another night.  Spoke with Dr. Elnoria HowardHung, cancel d/c for today.  Will see patient in am.  Patient understands this. Nursing will continue to monitor.

## 2014-06-12 NOTE — Discharge Summary (Signed)
Physician Discharge Summary  Patient ID: Amy Farrell MRN: [id number] DOB/AGE: [birth year] 31 y.o.  Admit date: 06/08/2014 Discharge date: 06/12/2014  Admission Diagnoses: 1) Aspiration pneumonia.                                         2) Pyloric stenosis  Discharge Diagnoses:  Active Problems:   Aspiration into airway   Aspiration pneumonia   SOB (shortness of breath)   Gastric outlet obstruction   Discharged Condition: good  Hospital Course: This is a 29 year old female with pyloric stenosis admitted to the hospital after an EGD with aspiration pneumonia.  On the day of admission Pulmonary was consulted and they recommended to hold off on antibiotics as she may only have an aspiration pneumonitis.  Her oxygen saturation over the first hospital night was uneventful, but she was not able to be weaned below 4 liters of oxygen.  She had a productive cough, but she was feeling better.  On the evening of hospital day #2 she had a low grade temperature at 100 F and she was feeling poorly.  Pulmonary then started Unasyn and she subsequently felt better.  Her oxygen saturation over the intervening days were able to be maintained without oxygen.  On the day of discharge she had >15 hours of oxygen saturation in the mid 90's without oxygen.  The patient was still coughing, but she felt well enough to go home and Pulmonary felt she was clinical well for discharge.  As for her pyloric stenosis she was placed on IV Protonix, but she felt that it was not as beneficial as the Dexilant samples from the office.  She was only maintained on a soft diet during this hospitalization.  At the time of discharge she will be treated for an additional 7 days with Augmentin 875 mg BID and I asked her to come by the office to obtain more samples of Dexilant before returning home.  Consults: pulmonary/intensive care  Significant Diagnostic Studies: labs: routine blood work and radiology: CXR: bilateral infiltrates consistent  with aspiration pneumonia.  Treatments: IV hydration, antibiotics: Unasyn and analgesia: ketordolac  Discharge Exam: Blood pressure 121/78, pulse 73, temperature 99 F (37.2 C), temperature source Oral, resp. rate 18, height 5\' 3"  (1.6 m), weight 51.1 kg (112 lb 10.5 oz), last menstrual period 06/08/2014, SpO2 92 %. General appearance: alert and no distress Resp: some crackles at the bases Cardio: regular rate and rhythm, S1, S2 normal, no murmur, click, rub or gallop GI: soft, non-tender; bowel sounds normal; no masses,  no organomegaly Extremities: extremities normal, atraumatic, no cyanosis or edema  Disposition: Home     Medication List    TAKE these medications        amoxicillin-clavulanate 875-125 MG per tablet  Commonly known as:  AUGMENTIN  Take 1 tablet by mouth 2 (two) times daily.     guaifenesin 100 MG/5ML syrup  Commonly known as:  ROBITUSSIN  Take 10 mLs (200 mg total) by mouth every 4 (four) hours as needed for congestion.         SignedJeani Hawking: Phylisha Dix D 06/12/2014, 11:47 AM

## 2014-06-12 NOTE — Progress Notes (Signed)
PULMONARY / CRITICAL CARE MEDICINE   Name: Amy Farrell MRN: [id number] DOB: 04-Jan-1986    ADMISSION DATE:  06/08/2014 CONSULTATION DATE:  06/08/2014  REFERRING MD :  Elnoria HowardHung  CHIEF COMPLAINT:  Shortness of breath, cough  INITIAL PRESENTATION: 29 y/o female with pyloric stenosis who underwent endoscopy on 06/08/2014 and unfortunately had an aspiration event during the procedure. She was admitted on 06[facility]08/2014 [facility]. Pulmonary and critical care medicine was consulted for further evaluation.  STUDIES:    SIGNIFICANT EVENTS:    SUBJECTIVE:  3/7 - Feeling better today, no longer on O2. Cough improved. Hasn't ambulated much.   VITAL SIGNS: Temp:  [99 F (37.2 C)-99.7 F (37.6 C)] 99 F (37.2 C) (03/07 0554) Pulse Rate:  [73-95] 73 (03/07 0554) Resp:  [18] 18 (03/07 0554) BP: (121-127)/(78-83) 121/78 mmHg (03/07 0554) SpO2:  [93 %-95 %] 93 % (03/07 0554) HEMODYNAMICS:   VENTILATOR SETTINGS:   INTAKE / OUTPUT:  Intake/Output Summary (Last 24 hours) at 06/12/14 1028 Last data filed at 06/12/14 0554  Gross per 24 hour  Intake    200 ml  Output      4 ml  Net    196 ml   CXR 3/5  PHYSICAL EXAMINATION: General:  Comfortable in bed Neuro:  Awake and alert, non-focal HEENT:  NCAT EOMi Cardiovascular:  RRR, no mgr Lungs:  Unlabored on RA, coarse LLL Abdomen:  BS+, soft, nontender Musculoskeletal:  Normal bulk and tone Skin:  No breakdown  LABS:  CBC  Recent Labs Lab 06/08/14 1332 06/08/14 1346 06/11/14 0451 06/12/14 0600  WBC 5.3  --  11.1* 5.7  HGB 13.5 15.0 11.9* 11.8*  HCT 42.2 44.0 36.4 36.2  PLT 207  --  181 183   Coag's No results for input(s): APTT, INR in the last 168 hours. BMET  Recent Labs Lab 06/08/14 1346 06/11/14 0451  NA 141 137  K 3.5 3.7  CL 102 107  CO2  --  24  BUN 11 9  CREATININE 0.80 0.72  GLUCOSE 109* 83   Electrolytes  Recent Labs Lab 06/11/14 0451  CALCIUM 8.1*   Sepsis Markers No results  for input(s): LATICACIDVEN, PROCALCITON, O2SATVEN in the last 168 hours. ABG No results for input(s): PHART, PCO2ART, PO2ART in the last 168 hours. Liver Enzymes No results for input(s): AST, ALT, ALKPHOS, BILITOT, ALBUMIN in the last 168 hours. Cardiac Enzymes No results for input(s): TROPONINI, PROBNP in the last 168 hours. Glucose No results for input(s): GLUCAP in the last 168 hours.  Imaging No results found.  CXR 3/7: persistent L > R infiltrates. Marginally worse on left than prior film.   ASSESSMENT / PLAN:  PULMONARY A: Acute hypoxemic respiratory failure in the setting of aspiration pneumonitis > currently protecting airway.      Aspiration pneumonia P:   Unasyn 3/5>> D/c on Augmentin for 7 day total course Ambulatory desaturation assessment Incentive spirometry Flutter valve Guaifenesin OK to discharge from pulmonary standpoint if sats > 88% on room air while ambulating.   GASTROINTESTINAL A:  Pyloric stenosis P:   Per GI Diet per GI Eating   Joneen RoachPaul Hoffman, AGACNP-BC [place] Pulmonology/Critical Care Pager [phone] or [phone]  Still requiring some O2 but dramatic improvement.  Was not on O2 at home.  Recommend ambulation and if sats remain >88% then may D/C.  PCCM will sign off, please call back if needed.  Patient seen and examined, agree with above note.  I dictated the  care and orders written for this patient under my direction.  Alyson Reedy, M.D. [facility] Pulmonary/Critical Care Medicine. Pager: [phone]. After hours pager: [phone].

## 2014-06-12 NOTE — Progress Notes (Addendum)
Subjective: Difficult night with coughing and she felt nauseous.  Objective: Vital signs in last 24 hours: Temp:  [99 F (37.2 C)-99.7 F (37.6 C)] 99 F (37.2 C) (03/07 0554) Pulse Rate:  [73-95] 73 (03/07 0554) Resp:  [18] 18 (03/07 0554) BP: (121-127)/(78-83) 121/78 mmHg (03/07 0554) SpO2:  [93 %-95 %] 93 % (03/07 0554) Last BM Date: 06/07/14  Intake/Output from previous day: 03/06 0701 - 03/07 0700 In: 200 [IV Piggyback:200] Out: 4 [Urine:4] Intake/Output this shift:    General appearance: alert and no distress Resp: some crackles at the bases GI: soft, non-tender; bowel sounds normal; no masses,  no organomegaly  Lab Results:  Recent Labs  06/11/14 0451 06/12/14 0600  WBC 11.1* 5.7  HGB 11.9* 11.8*  HCT 36.4 36.2  PLT 181 183   BMET  Recent Labs  06/11/14 0451  NA 137  K 3.7  CL 107  CO2 24  GLUCOSE 83  BUN 9  CREATININE 0.72  CALCIUM 8.1*   LFT No results for input(s): PROT, ALBUMIN, AST, ALT, ALKPHOS, BILITOT, BILIDIR, IBILI in the last 72 hours. PT/INR No results for input(s): LABPROT, INR in the last 72 hours. Hepatitis Panel No results for input(s): HEPBSAG, HCVAB, HEPAIGM, HEPBIGM in the last 72 hours. C-Diff No results for input(s): CDIFFTOX in the last 72 hours. Fecal Lactopherrin No results for input(s): FECLLACTOFRN in the last 72 hours.  Studies/Results: No results found.  Medications:  Scheduled: . ampicillin-sulbactam (UNASYN) IV  1.5 g Intravenous Q6H  . pantoprazole  40 mg Oral BID AC   Continuous:   Assessment/Plan: 1) Aspiration pneumonia. 2) Pyloric stenosis.   She is off of oxygen.  She appears better, but she had a difficult night.  The patient desires to go home, however, I will defer the finalization of this decision to Pulmonary.    Plan: 1) Continue with Unasyn. 2) Discharge status pending Pulmonary evaluation.   ADDENDUM: Nursing informed me that she desaturated with exertion.  Pulmonary feels that she  cannot be discharged home.  I cancel her discharge and reassess tomorrow.  LOS: 4 days   Modest Draeger D 06/12/2014, 8:28 AM

## 2014-06-13 NOTE — Progress Notes (Signed)
O2 sat 97% after ambulating length of hall

## 2014-06-13 NOTE — Progress Notes (Signed)
The patient is well.  Her oxygen saturation was at 97% with exertion and she feels well.  I will discharge her with Augmentin and she will follow up with me in one month, but I will contact her next week to obtain an update on her clinical status.  She knows to call with any questions.  Her lungs were improved as there were less bibasilar crackles.

## 2014-06-26 ENCOUNTER — Other Ambulatory Visit: Payer: Self-pay | Admitting: Gastroenterology

## 2014-06-27 ENCOUNTER — Encounter (HOSPITAL_COMMUNITY): Payer: Self-pay

## 2014-06-27 ENCOUNTER — Ambulatory Visit (HOSPITAL_COMMUNITY): Admit: 2014-06-27 | Payer: Self-pay | Admitting: Gastroenterology

## 2014-06-27 SURGERY — ESOPHAGOGASTRODUODENOSCOPY (EGD) WITH PROPOFOL
Anesthesia: General

## 2014-07-03 ENCOUNTER — Other Ambulatory Visit: Payer: Self-pay | Admitting: Gastroenterology

## 2014-07-06 ENCOUNTER — Encounter (HOSPITAL_COMMUNITY): Payer: Self-pay | Admitting: *Deleted

## 2014-07-07 ENCOUNTER — Encounter (HOSPITAL_COMMUNITY): Admission: RE | Payer: Self-pay | Source: Ambulatory Visit

## 2014-07-07 ENCOUNTER — Ambulatory Visit (HOSPITAL_COMMUNITY): Payer: PRIVATE HEALTH INSURANCE | Admitting: Anesthesiology

## 2014-07-07 ENCOUNTER — Encounter (HOSPITAL_COMMUNITY): Payer: Self-pay | Admitting: Anesthesiology

## 2014-07-07 ENCOUNTER — Encounter (HOSPITAL_COMMUNITY)
Admission: RE | Disposition: A | Discharge status: Home / Self Care | Payer: Self-pay | Source: Ambulatory Visit | Attending: Gastroenterology

## 2014-07-07 ENCOUNTER — Ambulatory Visit (HOSPITAL_COMMUNITY): Admission: RE | Admit: 2014-07-07 | Payer: MEDICAID | Source: Ambulatory Visit | Admitting: Gastroenterology

## 2014-07-07 ENCOUNTER — Ambulatory Visit (HOSPITAL_COMMUNITY)
Admission: RE | Admit: 2014-07-07 | Discharge: 2014-07-07 | Disposition: A | Discharge status: Home / Self Care | Payer: PRIVATE HEALTH INSURANCE | Source: Ambulatory Visit | Attending: Gastroenterology | Admitting: Gastroenterology

## 2014-07-07 DIAGNOSIS — R1310 Dysphagia, unspecified: Secondary | ICD-10-CM | POA: Diagnosis present

## 2014-07-07 DIAGNOSIS — K311 Adult hypertrophic pyloric stenosis: Secondary | ICD-10-CM | POA: Insufficient documentation

## 2014-07-07 DIAGNOSIS — K209 Esophagitis, unspecified: Secondary | ICD-10-CM | POA: Diagnosis not present

## 2014-07-07 HISTORY — PX: ESOPHAGOGASTRODUODENOSCOPY: SHX5428

## 2014-07-07 HISTORY — PX: BALLOON DILATION: SHX5330

## 2014-07-07 SURGERY — BALLOON DILATION
Anesthesia: General

## 2014-07-07 SURGERY — EGD (ESOPHAGOGASTRODUODENOSCOPY)
Anesthesia: General

## 2014-07-07 MED ORDER — FENTANYL CITRATE 0.05 MG/ML IJ SOLN
INTRAMUSCULAR | Status: DC | PRN
  Administered 2014-07-07: 50 ug via INTRAVENOUS

## 2014-07-07 MED ORDER — LACTATED RINGERS IV SOLN
INTRAVENOUS | Status: DC
  Administered 2014-07-07: 1000 mL via INTRAVENOUS

## 2014-07-07 MED ORDER — FENTANYL CITRATE 0.05 MG/ML IJ SOLN
INTRAMUSCULAR | Status: AC
  Filled 2014-07-07: qty 2

## 2014-07-07 MED ORDER — DEXAMETHASONE SODIUM PHOSPHATE 4 MG/ML IJ SOLN
INTRAMUSCULAR | Status: DC | PRN
  Administered 2014-07-07: 5 mg via INTRAVENOUS

## 2014-07-07 MED ORDER — MIDAZOLAM HCL 5 MG/5ML IJ SOLN
INTRAMUSCULAR | Status: DC | PRN
  Administered 2014-07-07: 2 mg via INTRAVENOUS

## 2014-07-07 MED ORDER — LIDOCAINE HCL (CARDIAC) 20 MG/ML IV SOLN
INTRAVENOUS | Status: DC | PRN
  Administered 2014-07-07: 25 mg via INTRAVENOUS
  Administered 2014-07-07 (×2): 50 mg via INTRAVENOUS

## 2014-07-07 MED ORDER — MIDAZOLAM HCL 2 MG/2ML IJ SOLN
INTRAMUSCULAR | Status: AC
  Filled 2014-07-07: qty 2

## 2014-07-07 MED ORDER — LIDOCAINE HCL (CARDIAC) 20 MG/ML IV SOLN
INTRAVENOUS | Status: AC
  Filled 2014-07-07: qty 5

## 2014-07-07 MED ORDER — DEXAMETHASONE SODIUM PHOSPHATE 10 MG/ML IJ SOLN
INTRAMUSCULAR | Status: AC
  Filled 2014-07-07: qty 1

## 2014-07-07 MED ORDER — ONDANSETRON HCL 4 MG/2ML IJ SOLN
INTRAMUSCULAR | Status: AC
  Filled 2014-07-07: qty 2

## 2014-07-07 MED ORDER — SUCCINYLCHOLINE CHLORIDE 20 MG/ML IJ SOLN
INTRAMUSCULAR | Status: DC | PRN
  Administered 2014-07-07: 80 mg via INTRAVENOUS

## 2014-07-07 MED ORDER — PROPOFOL 10 MG/ML IV BOLUS
INTRAVENOUS | Status: AC
  Filled 2014-07-07: qty 20

## 2014-07-07 MED ORDER — SODIUM CHLORIDE 0.9 % IV SOLN: INTRAVENOUS | Status: DC

## 2014-07-07 MED ORDER — ONDANSETRON HCL 4 MG/2ML IJ SOLN
INTRAMUSCULAR | Status: DC | PRN
  Administered 2014-07-07: 4 mg via INTRAVENOUS

## 2014-07-07 MED ORDER — PROPOFOL 10 MG/ML IV BOLUS
INTRAVENOUS | Status: DC | PRN
  Administered 2014-07-07: 100 mg via INTRAVENOUS

## 2014-07-07 NOTE — H&P (View-Only) (Signed)
The patient is well.  Her oxygen saturation was at 97% with exertion and she feels well.  I will discharge her with Augmentin and she will follow up with me in one month, but I will contact her next week to obtain an update on her clinical status.  She knows to call with any questions.  Her lungs were improved as there were less bibasilar crackles.

## 2014-07-07 NOTE — Interval H&P Note (Signed)
History and Physical Interval Note:  07/07/2014 8:08 AM  Amy Farrell  has presented today for surgery, with the diagnosis of dysphagia  The various methods of treatment have been discussed with the patient and family. After consideration of risks, benefits and other options for treatment, the patient has consented to  Procedure(s): ESOPHAGOGASTRODUODENOSCOPY (EGD) (N/A) MALONEY DILATION (N/A) as a surgical intervention .  The patient's history has been reviewed, patient examined, no change in status, stable for surgery.  I have reviewed the patient's chart and labs.  Questions were answered to the patient's satisfaction.    She has managed to gain 5 lbs.  I will repeat the EGD with dilation under general anesthesia.  Sudais Banghart D

## 2014-07-07 NOTE — Op Note (Signed)
[facility] [address] [place], [id number]   ENDOSCOPY PROCEDURE REPORT  PATIENT: Amy Farrell, Amy Farrell  MR#: [id number] BIRTHDATE: 02/26/1986 , 28  yrs. old GENDER: female ENDOSCOPIST:Chia Mowers Elnoria HowardHung, MD REFERRED BY: PROCEDURE DATE:  07/07/2014 PROCEDURE:   EGD w/ balloon dilation ASA CLASS:    Class I INDICATIONS: Pyloric stenosis MEDICATION: General anesthesia TOPICAL ANESTHETIC:   None  DESCRIPTION OF PROCEDURE:   After the risks and benefits of the procedure were explained, informed consent was obtained.  The Pentax Gastroscope [id number]  endoscope was introduced through the mouth  and advanced to the second portion of the duodenum .  The instrument was slowly withdrawn as the mucosa was fully examined. Estimated blood loss is zero unless otherwise noted in this procedure report.      FINDINGS: The esophagus revealed an LA Grade D esophagitis.  It has worsened since the last EGD.  In the gastric fundus and lumen there was a significant amount of retained fluid and gastric contents. It was too thick to suction out of the stomach lumen.  At the pylorus there was a persistent stenosis.  In fact, it did not appear to have any response to the prior dilations.  The last dilation was up to 15 mm and the endoscope was not able to pass through the area.  The lumen was severely stenosed.  A 15, 16.5, 18 mm dilation balloon was inserted across the stricture with ease. The stenosis was dilated up to 15 mm, but even at this diameter the endoscope was not able to pass through the stricture.  The area was then dilated up to 16.5 mm and subsequently the endoscope was able to pass through with some ease.  Because of the severity of the stenosis an incremental increase in dilation up to 17mm was performed.  During this time dilation of the stricture was closely monitored and there was no increase in blanching or any notable mucosal tearing.  I then had the tech slowly dilate up to  18 mm with continued observation of the stricture dilation, image 015. Again, there was no evidence of increased blanching or mucosal tearing.  The dilation was held for 1 minute.  Subsequently the endoscope traversed the area with ease and the stricture was identified post dilation on image 017.    Retroflexed views revealed no abnormalities.    The scope was then withdrawn from the patient and the procedure completed.  COMPLICATIONS: There were no immediate complications.  ENDOSCOPIC IMPRESSION: 1) LA Grade D esophagitis. 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  RECOMMENDATIONS: 1) Continue with PPI BID. 2) Phone update in 1 week. 3) ? repeat dilation in 1-2 weeks versus Surgical consultation.    _______________________________ eSignedJeani Hawking:  Jeray Shugart, MD 07/07/2014 9:22 AM     cc:  CPT CODES: ICD CODES:  The ICD and CPT codes recommended by this software are interpretations from the data that the clinical staff has captured with the software.  The verification of the translation of this report to the ICD and CPT codes and modifiers is the sole responsibility of the health care institution and practicing physician where this report was generated.  [employer] will not be held responsible for the validity of the ICD and CPT codes included on this report.  AMA assumes no liability for data contained or not contained herein. CPT is a Publishing rights managerregistered trademark of the [employer].  PATIENT NAME:  Amy Farrell, Amy Farrell MR#: [id number]

## 2014-07-07 NOTE — Discharge Instructions (Signed)
Esophagogastroduodenoscopy °Care After °Refer to this sheet in the next few weeks. These instructions provide you with information on caring for yourself after your procedure. Your caregiver may also give you more specific instructions. Your treatment has been planned according to current medical practices, but problems sometimes occur. Call your caregiver if you have any problems or questions after your procedure.  °HOME CARE INSTRUCTIONS °· Do not eat or drink anything until the numbing medicine (local anesthetic) has worn off and your gag reflex has returned. You will know that the local anesthetic has worn off when you can swallow comfortably. °· Do not drive for 12 hours after the procedure or as directed by your caregiver. °· Only take medicines as directed by your caregiver. °SEEK MEDICAL CARE IF:  °· You cannot stop coughing. °· You are not urinating at all or less than usual. °SEEK IMMEDIATE MEDICAL CARE IF: °· You have difficulty swallowing. °· You cannot eat or drink. °· You have worsening throat or chest pain. °· You have dizziness, lightheadedness, or you faint. °· You have nausea or vomiting. °· You have chills. °· You have a fever. °· You have severe abdominal pain. °· You have black, tarry, or bloody stools. °Document Released: 03/10/2012 Document Reviewed: 03/10/2012 °ExitCare® Patient Information ©2015 [employer]. This information is not intended to replace advice given to you by your health care provider. Make sure you discuss any questions you have with your health care provider. °General Anesthesia, Care After °Refer to this sheet in the next few weeks. These instructions provide you with information on caring for yourself after your procedure. Your health care provider may also give you more specific instructions. Your treatment has been planned according to current medical practices, but problems sometimes occur. Call your health care provider if you have any problems or questions after  your procedure. °WHAT TO EXPECT AFTER THE PROCEDURE °After the procedure, it is typical to experience: °· Sleepiness. °· Nausea and vomiting. °HOME CARE INSTRUCTIONS °· For the first 24 hours after general anesthesia: °¨ Have a responsible person with you. °¨ Do not drive a car. If you are alone, do not take public transportation. °¨ Do not drink alcohol. °¨ Do not take medicine that has not been prescribed by your health care provider. °¨ Do not sign important papers or make important decisions. °¨ You may resume a normal diet and activities as directed by your health care provider. °· Change bandages (dressings) as directed. °· If you have questions or problems that seem related to general anesthesia, call the hospital and ask for the anesthetist or anesthesiologist on call. °SEEK MEDICAL CARE IF: °· You have nausea and vomiting that continue the day after anesthesia. °· You develop a rash. °SEEK IMMEDIATE MEDICAL CARE IF:  °· You have difficulty breathing. °· You have chest pain. °· You have any allergic problems. °Document Released: 06/30/2000 Document Revised: 03/29/2013 Document Reviewed: 10/07/2012 °ExitCare® Patient Information ©2015 [employer]. This information is not intended to replace advice given to you by your health care provider. Make sure you discuss any questions you have with your health care provider. ° °

## 2014-07-07 NOTE — Anesthesia Postprocedure Evaluation (Signed)
  Anesthesia Post-op Note  Patient: Amy Farrell  Procedure(s) Performed: Procedure(s) (LRB): ESOPHAGOGASTRODUODENOSCOPY (EGD) (N/A) BALLOON DILATION (N/A)  Patient Location: PACU  Anesthesia Type: General  Level of Consciousness: awake and alert   Airway and Oxygen Therapy: Patient Spontanous Breathing  Post-op Pain: mild  Post-op Assessment: Post-op Vital signs reviewed, Patient's Cardiovascular Status Stable, Respiratory Function Stable, Patent Airway and No signs of Nausea or vomiting  Last Vitals:  Filed Vitals:   07/07/14 0913  BP: 93/47  Pulse: 65  Temp: 36.6 C  Resp: 12    Post-op Vital Signs: stable   Complications: No apparent anesthesia complications

## 2014-07-07 NOTE — Transfer of Care (Signed)
Immediate Anesthesia Transfer of Care Note  Patient: Amy Farrell  Procedure(s) Performed: Procedure(s) (LRB): ESOPHAGOGASTRODUODENOSCOPY (EGD) (N/A) BALLOON DILATION (N/A)  Patient Location: PACU  Anesthesia Type: General  Level of Consciousness: sedated, patient cooperative and responds to stimulation  Airway & Oxygen Therapy: Patient Spontanous Breathing and Patient connected to face mask oxgen  Post-op Assessment: Report given to PACU RN and Post -op Vital signs reviewed and stable  Post vital signs: Reviewed and stable  Complications: No apparent anesthesia complications

## 2014-07-07 NOTE — Anesthesia Procedure Notes (Signed)
Procedure Name: Intubation Date/Time: 07/07/2014 8:25 AM Performed by: Paris LoreBLANTON, Amare Kontos M Pre-anesthesia Checklist: Patient identified, Emergency Drugs available, Suction available, Patient being monitored and Timeout performed Patient Re-evaluated:Patient Re-evaluated prior to inductionOxygen Delivery Method: Circle system utilized Preoxygenation: Pre-oxygenation with 100% oxygen Intubation Type: IV induction and Rapid sequence Laryngoscope Size: Mac and 4 Grade View: Grade I Tube type: Oral Tube size: 7.0 mm Number of attempts: 1 Airway Equipment and Method: Stylet Placement Confirmation: ETT inserted through vocal cords under direct vision,  positive ETCO2,  CO2 detector and breath sounds checked- equal and bilateral Secured at: 20 cm Tube secured with: Tape Dental Injury: Teeth and Oropharynx as per pre-operative assessment

## 2014-07-07 NOTE — Anesthesia Preprocedure Evaluation (Addendum)
Anesthesia Evaluation  Patient identified by MRN, date of birth, ID band Patient awake    Reviewed: Allergy & Precautions, NPO status , Patient's Chart, lab work & pertinent test results  Airway Mallampati: II  TM Distance: >3 FB Neck ROM: Full    Dental no notable dental hx.    Pulmonary shortness of breath, pneumonia -, resolved,  Aspiration pneumonia last month, now resolved. breath sounds clear to auscultation  Pulmonary exam normal       Cardiovascular Exercise Tolerance: Good negative cardio ROS  Rhythm:Regular Rate:Normal     Neuro/Psych negative neurological ROS  negative psych ROS   GI/Hepatic Neg liver ROS, Pyloric stenosis.   Endo/Other  negative endocrine ROS  Renal/GU negative Renal ROS  negative genitourinary   Musculoskeletal negative musculoskeletal ROS (+)   Abdominal   Peds negative pediatric ROS (+)  Hematology negative hematology ROS (+)   Anesthesia Other Findings   Reproductive/Obstetrics negative OB ROS                            Anesthesia Physical Anesthesia Plan  ASA: II  Anesthesia Plan: General   Post-op Pain Management:    Induction: Intravenous  Airway Management Planned: Oral ETT  Additional Equipment:   Intra-op Plan:   Post-operative Plan: Extubation in OR  Informed Consent: I have reviewed the patients History and Physical, chart, labs and discussed the procedure including the risks, benefits and alternatives for the proposed anesthesia with the patient or authorized representative who has indicated his/her understanding and acceptance.   Dental advisory given  Plan Discussed with: CRNA  Anesthesia Plan Comments:        Anesthesia Quick Evaluation

## 2014-07-10 ENCOUNTER — Encounter (HOSPITAL_COMMUNITY): Payer: Self-pay | Admitting: Gastroenterology

## 2016-04-25 IMAGING — CR DG CHEST 1V PORT
1 series · 1 of 1 positions shown · non-contrast
Comparison: None.

CLINICAL DATA: Shortness of Breath

EXAM:
PORTABLE CHEST - 1 VIEW

[AP]
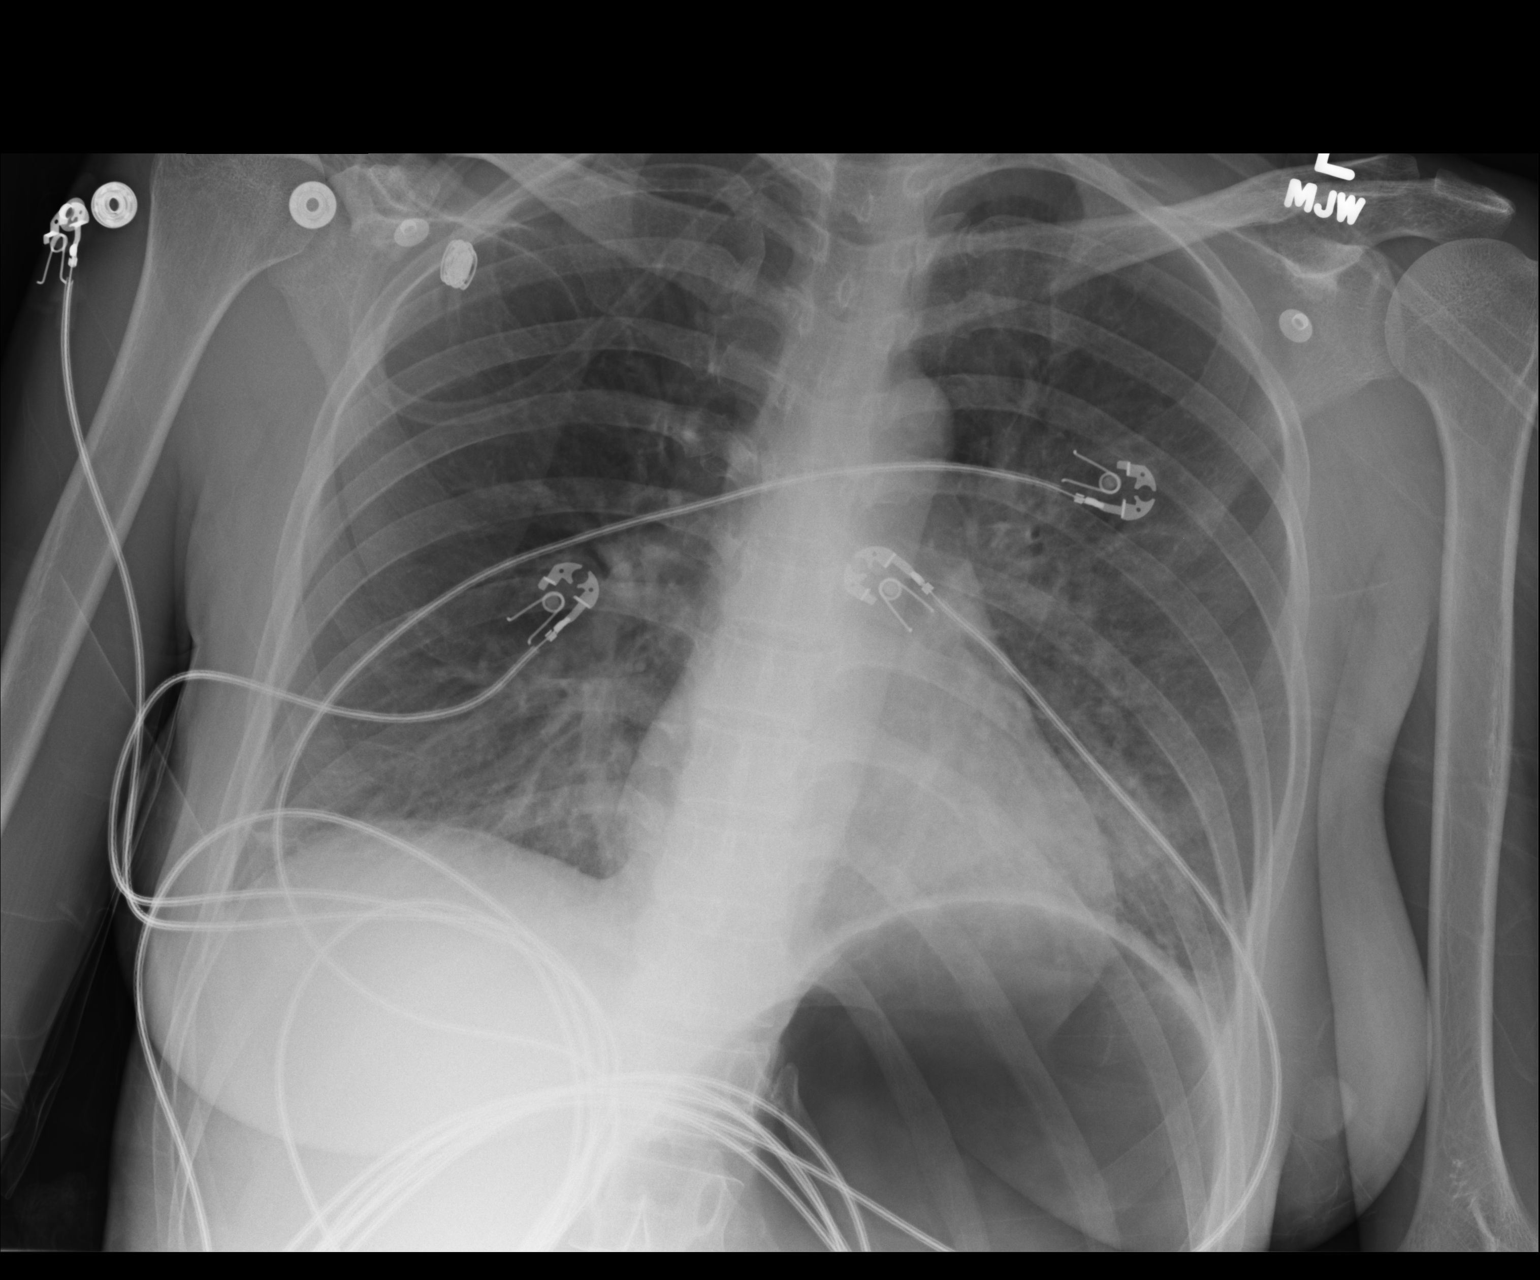

[1 of 1 positions shown; findings below may reference images not displayed]

FINDINGS: Cardiomediastinal silhouette is unremarkable. There is gaseous
distension of visualized stomach. There is streaky bilateral basilar
atelectasis or infiltrate left greater than right. No pulmonary
edema.
IMPRESSION: No pulmonary edema. Streaky bilateral basilar atelectasis or
infiltrate left greater than right. Significant gaseous distension
of visualized stomach.

## 2016-04-27 IMAGING — CR DG CHEST 2V
2 series · 2 of 2 positions shown · non-contrast
Comparison: 06/08/2014.

CLINICAL DATA: Aspiration into the airway. Shortness of breath with
midsternal chest pain for 2 days ago

EXAM:
CHEST  2 VIEW

[chest pa]
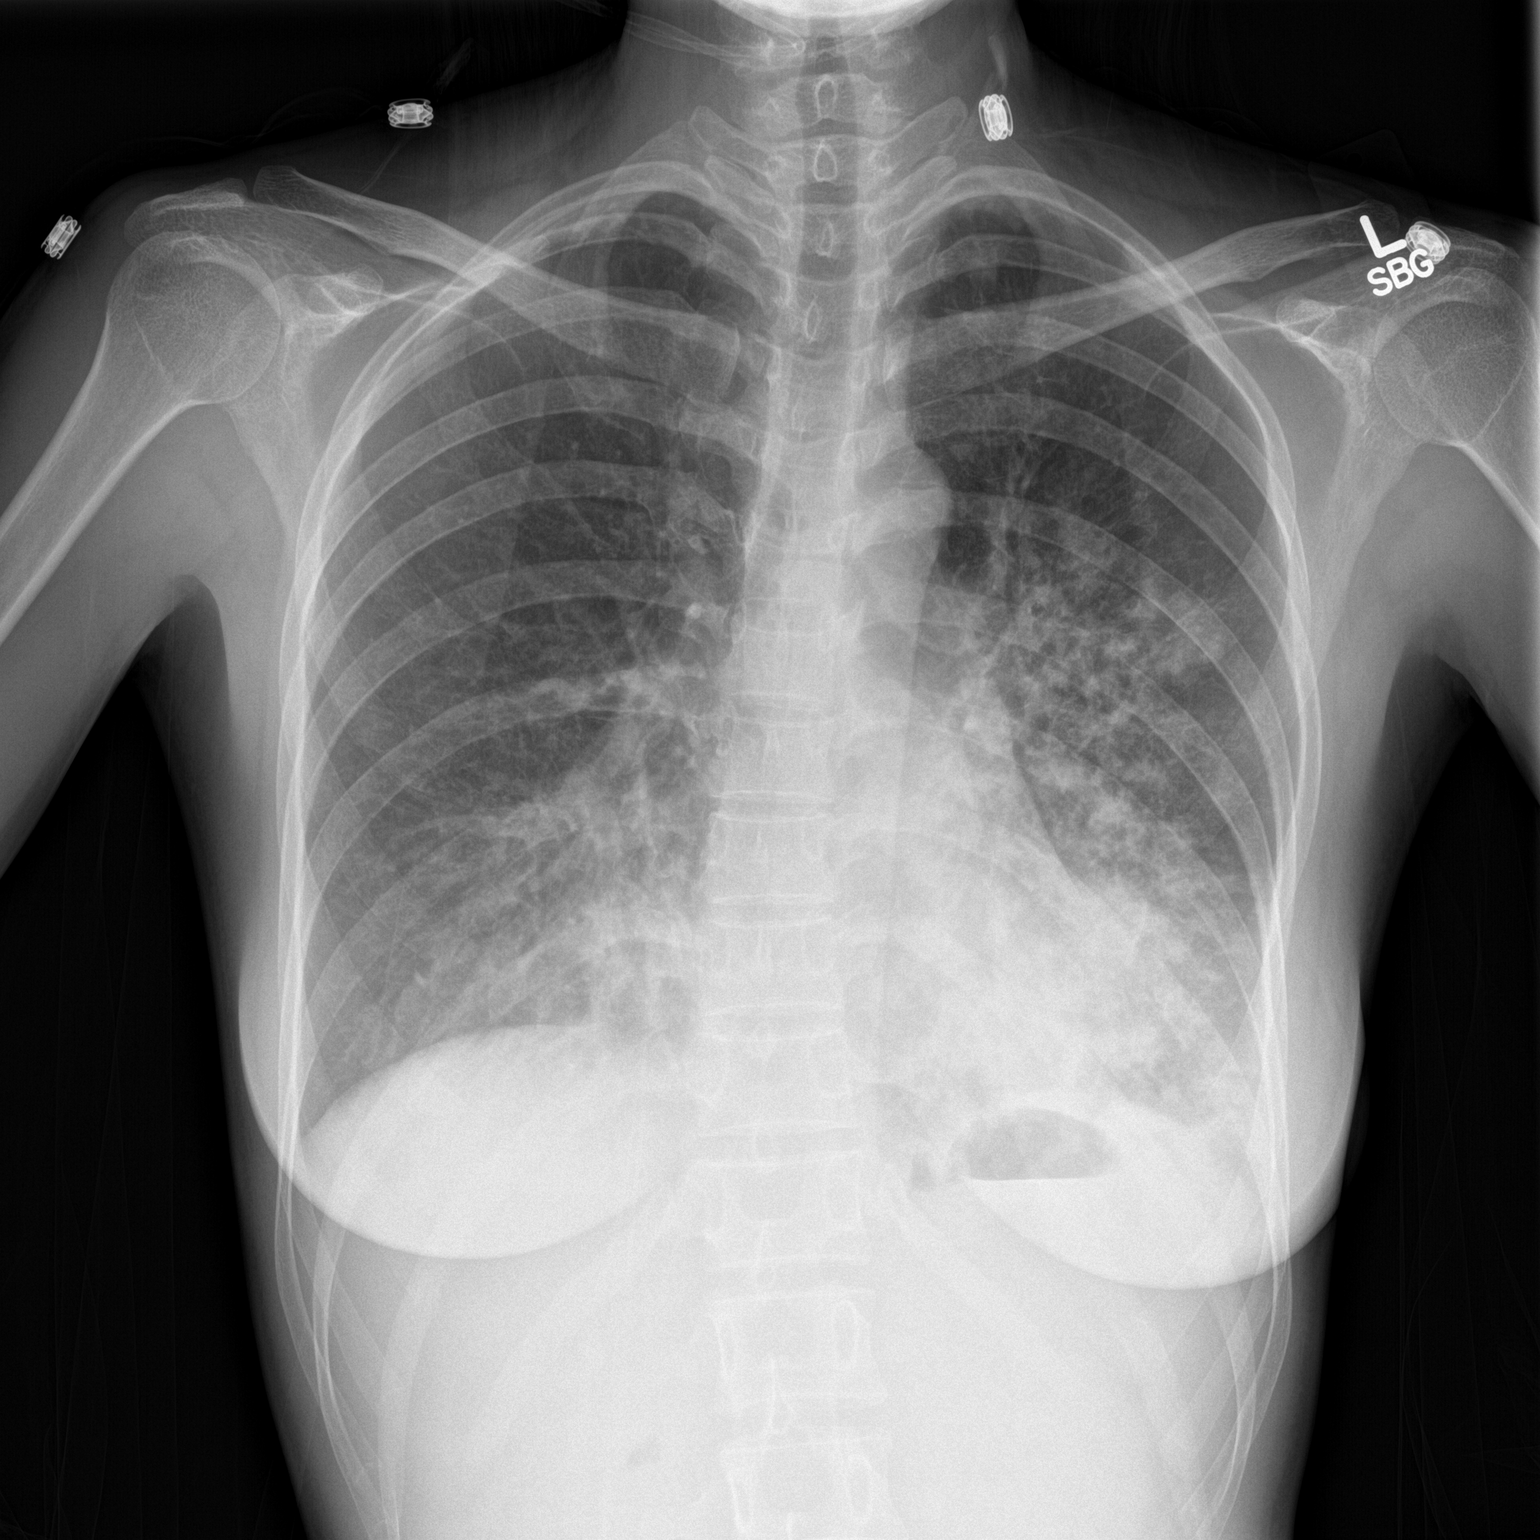

[chest lat]
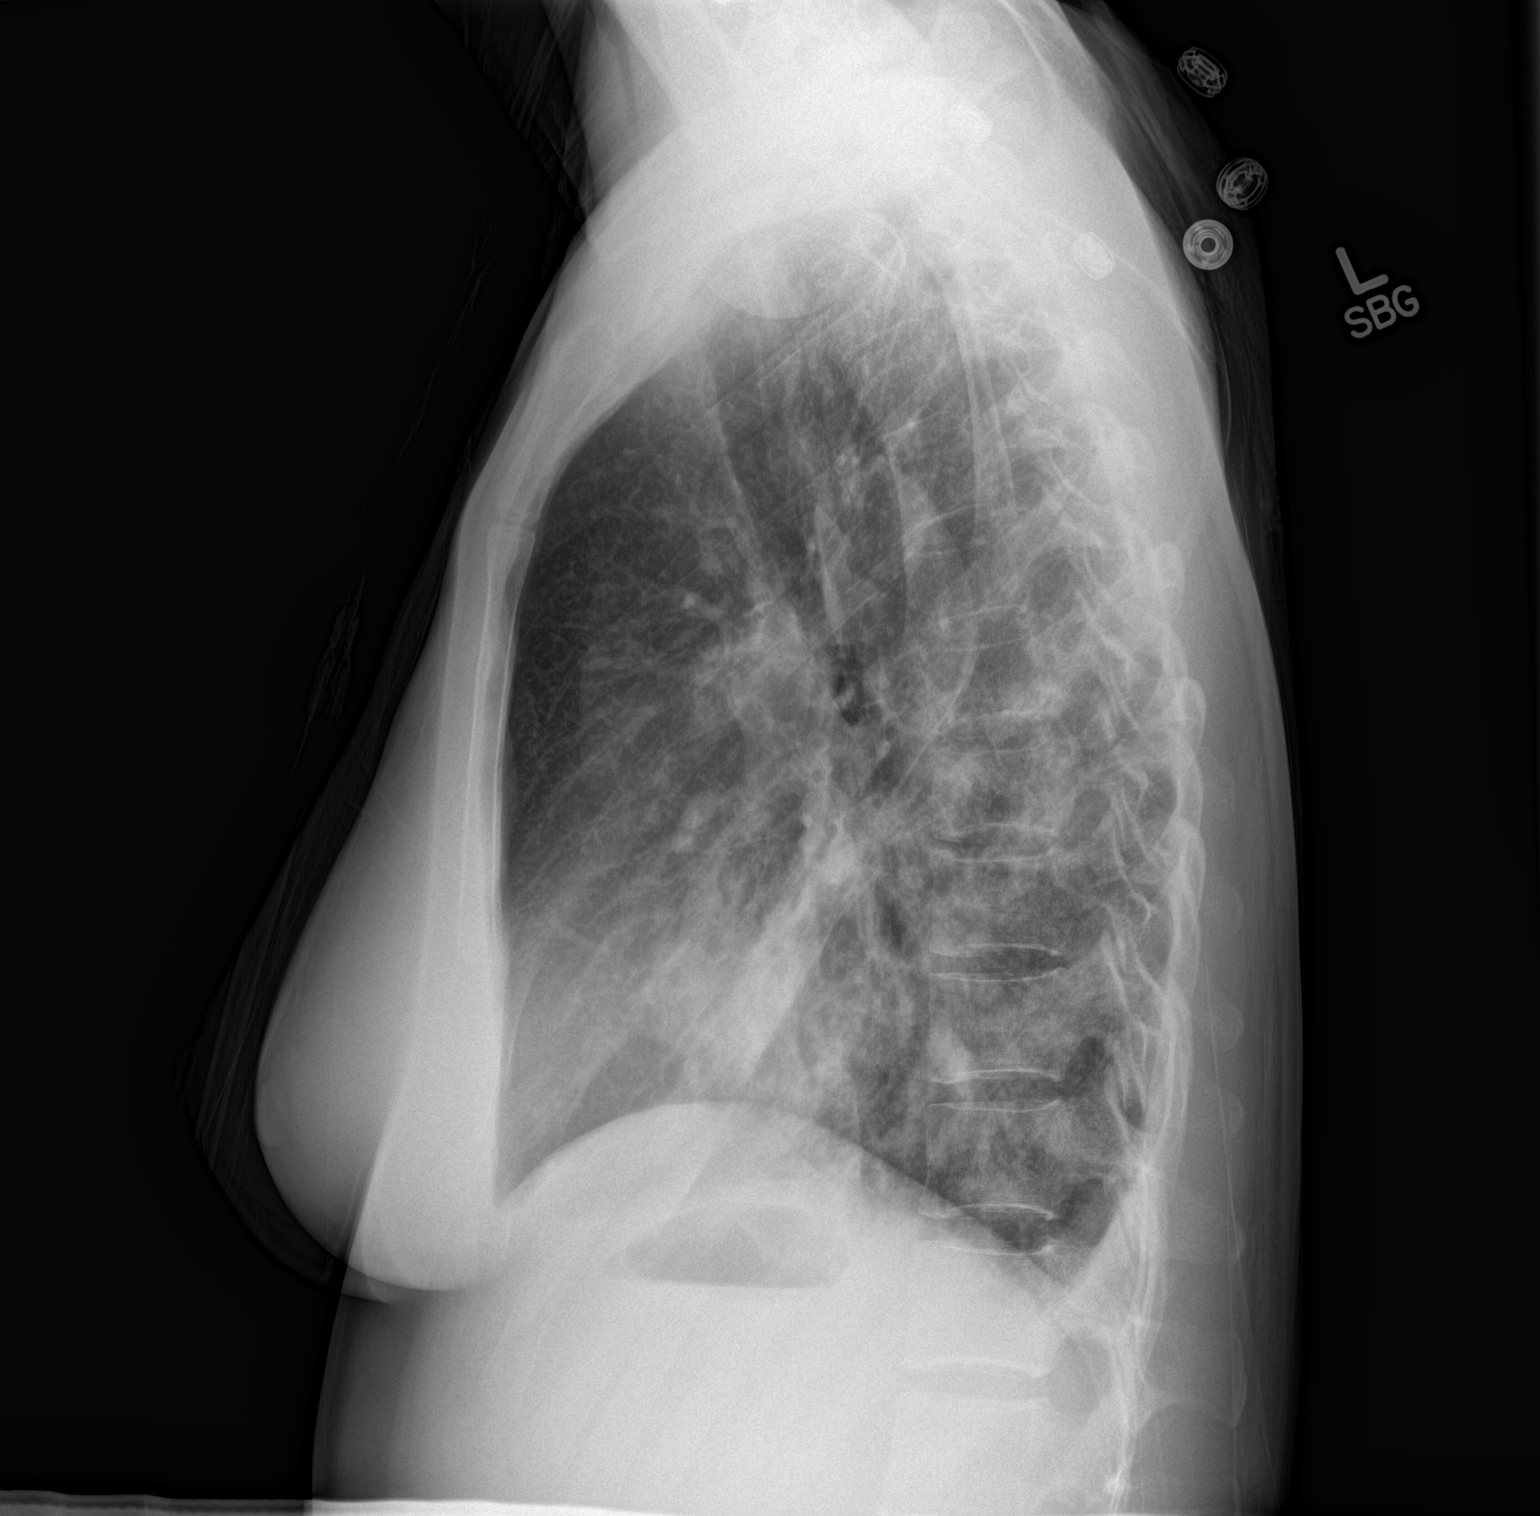

[2 of 2 positions shown; findings below may reference images not displayed]

FINDINGS: The heart size and mediastinal contours are stable. There is
significant interval worsening of bibasilar airspace opacities with
an asymmetric component in the left perihilar region. There are
probable small bilateral pleural effusions. No pneumothorax. Mild
convex right thoracic scoliosis.
IMPRESSION: Significant worsening of bilateral airspace opacities consistent
with aspiration pneumonia.
